# Patient Record
Sex: Male | Born: 1950 | Race: White | Hispanic: No | Marital: Married | Smoking: Never smoker
Health system: Southern US, Community
[De-identification: ages and names within clinical notes are randomized; demographics above are authoritative.]

## PROBLEM LIST (undated history)

## (undated) DIAGNOSIS — I10 Essential (primary) hypertension: Secondary | ICD-10-CM

---

## 2018-04-17 ENCOUNTER — Emergency Department (HOSPITAL_BASED_OUTPATIENT_CLINIC_OR_DEPARTMENT_OTHER)
Admission: EM | Admit: 2018-04-17 | Discharge: 2018-04-17 | Disposition: A | Discharge status: Home / Self Care | Payer: Medicare Other | Attending: Physician Assistant | Admitting: Physician Assistant

## 2018-04-17 ENCOUNTER — Other Ambulatory Visit: Payer: Self-pay

## 2018-04-17 ENCOUNTER — Encounter (HOSPITAL_BASED_OUTPATIENT_CLINIC_OR_DEPARTMENT_OTHER): Payer: Self-pay

## 2018-04-17 ENCOUNTER — Emergency Department (HOSPITAL_BASED_OUTPATIENT_CLINIC_OR_DEPARTMENT_OTHER): Payer: Medicare Other

## 2018-04-17 DIAGNOSIS — Z79899 Other long term (current) drug therapy: Secondary | ICD-10-CM | POA: Diagnosis not present

## 2018-04-17 DIAGNOSIS — R0789 Other chest pain: Secondary | ICD-10-CM | POA: Diagnosis present

## 2018-04-17 DIAGNOSIS — I10 Essential (primary) hypertension: Secondary | ICD-10-CM | POA: Insufficient documentation

## 2018-04-17 HISTORY — DX: Essential (primary) hypertension: I10

## 2018-04-17 LAB — CBC WITH DIFFERENTIAL/PLATELET
BASOS ABS: 0 10*3/uL (ref 0.0–0.1)
Basophils Relative: 1 %
Eosinophils Absolute: 0.3 10*3/uL (ref 0.0–0.7)
Eosinophils Relative: 4 %
HEMATOCRIT: 46.7 % (ref 39.0–52.0)
HEMOGLOBIN: 16.4 g/dL (ref 13.0–17.0)
LYMPHS ABS: 1.7 10*3/uL (ref 0.7–4.0)
Lymphocytes Relative: 25 %
MCH: 31.4 pg (ref 26.0–34.0)
MCHC: 35.1 g/dL (ref 30.0–36.0)
MCV: 89.3 fL (ref 78.0–100.0)
Monocytes Absolute: 0.9 10*3/uL (ref 0.1–1.0)
Monocytes Relative: 12 %
NEUTROS ABS: 4 10*3/uL (ref 1.7–7.7)
NEUTROS PCT: 58 %
PLATELETS: 230 10*3/uL (ref 150–400)
RBC: 5.23 MIL/uL (ref 4.22–5.81)
RDW: 12.8 % (ref 11.5–15.5)
WBC: 6.9 10*3/uL (ref 4.0–10.5)

## 2018-04-17 LAB — COMPREHENSIVE METABOLIC PANEL
ALT: 23 U/L (ref 17–63)
ANION GAP: 10 (ref 5–15)
AST: 26 U/L (ref 15–41)
Albumin: 4.2 g/dL (ref 3.5–5.0)
Alkaline Phosphatase: 44 U/L (ref 38–126)
BILIRUBIN TOTAL: 0.7 mg/dL (ref 0.3–1.2)
BUN: 12 mg/dL (ref 6–20)
CHLORIDE: 100 mmol/L — AB (ref 101–111)
CO2: 25 mmol/L (ref 22–32)
Calcium: 8.7 mg/dL — ABNORMAL LOW (ref 8.9–10.3)
Creatinine, Ser: 0.98 mg/dL (ref 0.61–1.24)
GFR calc Af Amer: >60 mL/min (ref >60)
Glucose, Bld: 105 mg/dL — ABNORMAL HIGH (ref 65–99)
Potassium: 3.6 mmol/L (ref 3.5–5.1)
Sodium: 135 mmol/L (ref 135–145)
TOTAL PROTEIN: 7.1 g/dL (ref 6.5–8.1)

## 2018-04-17 LAB — TROPONIN I: Troponin I: <0.03 ng/mL (ref <0.03)

## 2018-04-17 LAB — BRAIN NATRIURETIC PEPTIDE: B Natriuretic Peptide: 16.1 pg/mL (ref 0.0–100.0)

## 2018-04-17 LAB — D-DIMER, QUANTITATIVE (NOT AT ARMC): D-Dimer, Quant: 0.4 ug/mL-FEU (ref 0.00–0.50)

## 2018-04-17 NOTE — Discharge Instructions (Addendum)
We have ruled out a blood clot in your lungs and a heart attack today.  Please follow-up with your doctor for further evaluation and treatment.  You may need to see a cardiologist for further testing.  Please return to the emergency department if you develop any new or worsening symptoms including worsening chest pain, shortness of breath, or any other new or concerning symptom.

## 2018-04-17 NOTE — ED Provider Notes (Signed)
[facility] EMERGENCY DEPARTMENT Provider Note   CSN: [id number] Arrival date & time: 04/17/18  1444     History   Chief Complaint Chief Complaint  Patient presents with  . Chest Pain    HPI Scott Boyd is a 67 y.o. male with history of hypertension and high cholesterol who presents with a 1 day history of right sided chest pain.  He is asymptomatic now.  He described it as a 1/10  dull ache.  The pain does not radiate.  It has been constant since he awoke this morning.  He reports taking amlodipine for the first time last evening.  He denies any shortness of breath, nausea, vomiting, pleuritic pain.  He reports he was sweaty while he was sleeping last night, however it was hot in the room and he did not have their air conditioning on.  Patient denies any cough.  Patient denies any new leg pain or swelling.  However, patient did just return from [place] and was on a 30-hour flight less than 1 week ago.  No medications taken prior to arrival.  HPI  Past Medical History:  Diagnosis Date  . Hypertension     There are no active problems to display for this patient.   History reviewed. No pertinent surgical history.      Home Medications    Prior to Admission medications   Medication Sig Start Date End Date Taking? Authorizing Provider  amLODipine (NORVASC) 5 MG tablet Take 5 mg by mouth daily.   Yes [provider]  ezetimibe (ZETIA) 10 MG tablet Take 10 mg by mouth daily.   Yes [provider]  labetalol (NORMODYNE) 100 MG tablet Take 100 mg by mouth 2 (two) times daily.   Yes [provider]  levothyroxine (SYNTHROID, LEVOTHROID) 50 MCG tablet Take 50 mcg by mouth daily before breakfast.   Yes [provider]    Family History No family history on file.  Social History Social History   Tobacco Use  . Smoking status: Never Smoker  . Smokeless tobacco: Never Used  Substance Use Topics  . Alcohol use: Yes  . Drug use:  Never     Allergies   Naproxen and Prednisone   Review of Systems Review of Systems  Constitutional: Negative for chills and fever.  HENT: Negative for facial swelling and sore throat.   Respiratory: Negative for shortness of breath.   Cardiovascular: Positive for chest pain.  Gastrointestinal: Negative for abdominal pain, nausea and vomiting.  Genitourinary: Negative for dysuria.  Musculoskeletal: Negative for back pain.  Skin: Negative for rash and wound.  Neurological: Negative for headaches.  Psychiatric/Behavioral: The patient is not nervous/anxious.      Physical Exam Updated Vital Signs BP (!) 144/98   Pulse 64   Temp 98.3 F (36.8 C) (Oral)   Resp 15   Ht 5\' 11"  (1.803 m)   Wt 93 kg (205 lb)   SpO2 97%   BMI 28.59 kg/m   Physical Exam  Constitutional: He appears well-developed and well-nourished. No distress.  HENT:  Head: Normocephalic and atraumatic.  Mouth/Throat: Oropharynx is clear and moist. No oropharyngeal exudate.  Eyes: Pupils are equal, round, and reactive to light. Conjunctivae are normal. Right eye exhibits no discharge. Left eye exhibits no discharge. No scleral icterus.  Neck: Normal range of motion. Neck supple. No thyromegaly present.  Cardiovascular: Normal rate, regular rhythm, normal heart sounds and intact distal pulses. Exam reveals no gallop and no friction rub.  No murmur heard. Pulmonary/Chest: Effort normal and breath sounds normal. No stridor. No respiratory distress. He has no wheezes. He has no rales. He exhibits no tenderness.  Abdominal: Soft. Bowel sounds are normal. He exhibits no distension. There is no tenderness. There is no rebound and no guarding.  Musculoskeletal: He exhibits no edema.  Lymphadenopathy:    He has no cervical adenopathy.  Neurological: He is alert. Coordination normal.  Skin: Skin is warm and dry. No rash noted. He is not diaphoretic. No pallor.  Psychiatric: He has a normal mood and affect.  Nursing  note and vitals reviewed.    ED Treatments / Results  Labs (all labs ordered are listed, but only abnormal results are displayed) Labs Reviewed  COMPREHENSIVE METABOLIC PANEL - Abnormal; Notable for the following components:      Result Value   Chloride 100 (*)    Glucose, Bld 105 (*)    Calcium 8.7 (*)    All other components within normal limits  CBC WITH DIFFERENTIAL/PLATELET  TROPONIN I  BRAIN NATRIURETIC PEPTIDE  D-DIMER, QUANTITATIVE (NOT AT [facility])    EKG EKG Interpretation  Date/Time:  Friday April 17 2018 14:58:08 EDT Ventricular Rate:  67 PR Interval:  176 QRS Duration: 82 QT Interval:  352 QTC Calculation: 371 R Axis:   42 Text Interpretation:  Normal sinus rhythm Nonspecific T wave abnormality Abnormal ECG Normal sinus rhythm Confirmed by Bary CastillaMackuen, Courteney ([id number]) on 04/17/2018 7:52:23 PM   Radiology Dg Chest 2 View  Result Date: 04/17/2018 CLINICAL DATA:  Chest pain. EXAM: CHEST - 2 VIEW COMPARISON:  None. FINDINGS: Midline trachea. Borderline cardiomegaly with a tortuous thoracic aorta. Atherosclerosis in the transverse aorta. No pleural effusion or pneumothorax. Mild volume loss at the left lung base. No congestive failure. 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 IMPRESSION: No acute cardiopulmonary disease. Aortic Atherosclerosis (ICD10-I70.0). Electronically Signed   By: Jeronimo GreavesKyle  Talbot M.D.   On: 04/17/2018 17:40    Procedures Procedures (including critical care time)  Medications Ordered in ED Medications - No data to display   Initial Impression / Assessment and Plan / ED Course  I have reviewed the triage vital signs and the nursing notes.  Pertinent labs & imaging results that were available during my care of the patient were reviewed by me and considered in my medical decision making (see chart for details).     Patient presenting with constant, right-sided chest pain since awakening this morning.  His pain is very  atypical and resolved at this time.  CBC, CMP, troponin, d-dimer, BNP all within normal limits.  Chest x-ray is negative.  EKG shows NSR with nonspecific T wave abnormalities.  Very low suspicion of ACS at this time.  Follow-up to PCP for further workup.  Return precautions discussed.  Patient understands and agrees with plan.  Patient vitals stable throughout ED course and discharged in satisfactory condition.  Patient also evaluated by Dr. Corlis LeakMackuen who guided the patient's management and agrees with plan.  Final Clinical Impressions(s) / ED Diagnoses   Final diagnoses:  Atypical chest pain    ED Discharge Orders    None       Emi HolesLaw, Marveen Donlon M, PA-C 04/17/18 1952    Abelino DerrickMackuen, Courteney Lyn, MD 04/18/18 2350

## 2018-04-17 NOTE — ED Notes (Signed)
ED Provider at bedside. 

## 2018-04-17 NOTE — ED Triage Notes (Signed)
Pt c/o pain to rt of chest, dull feeling; pt states started amolodpine yesterday, PCP sent him here

## 2019-06-26 IMAGING — DX DG CHEST 2V
2 series · 2 of 2 positions shown · non-contrast
Comparison: None.

CLINICAL DATA: Chest pain.

EXAM:
CHEST - 2 VIEW

[chest pa]
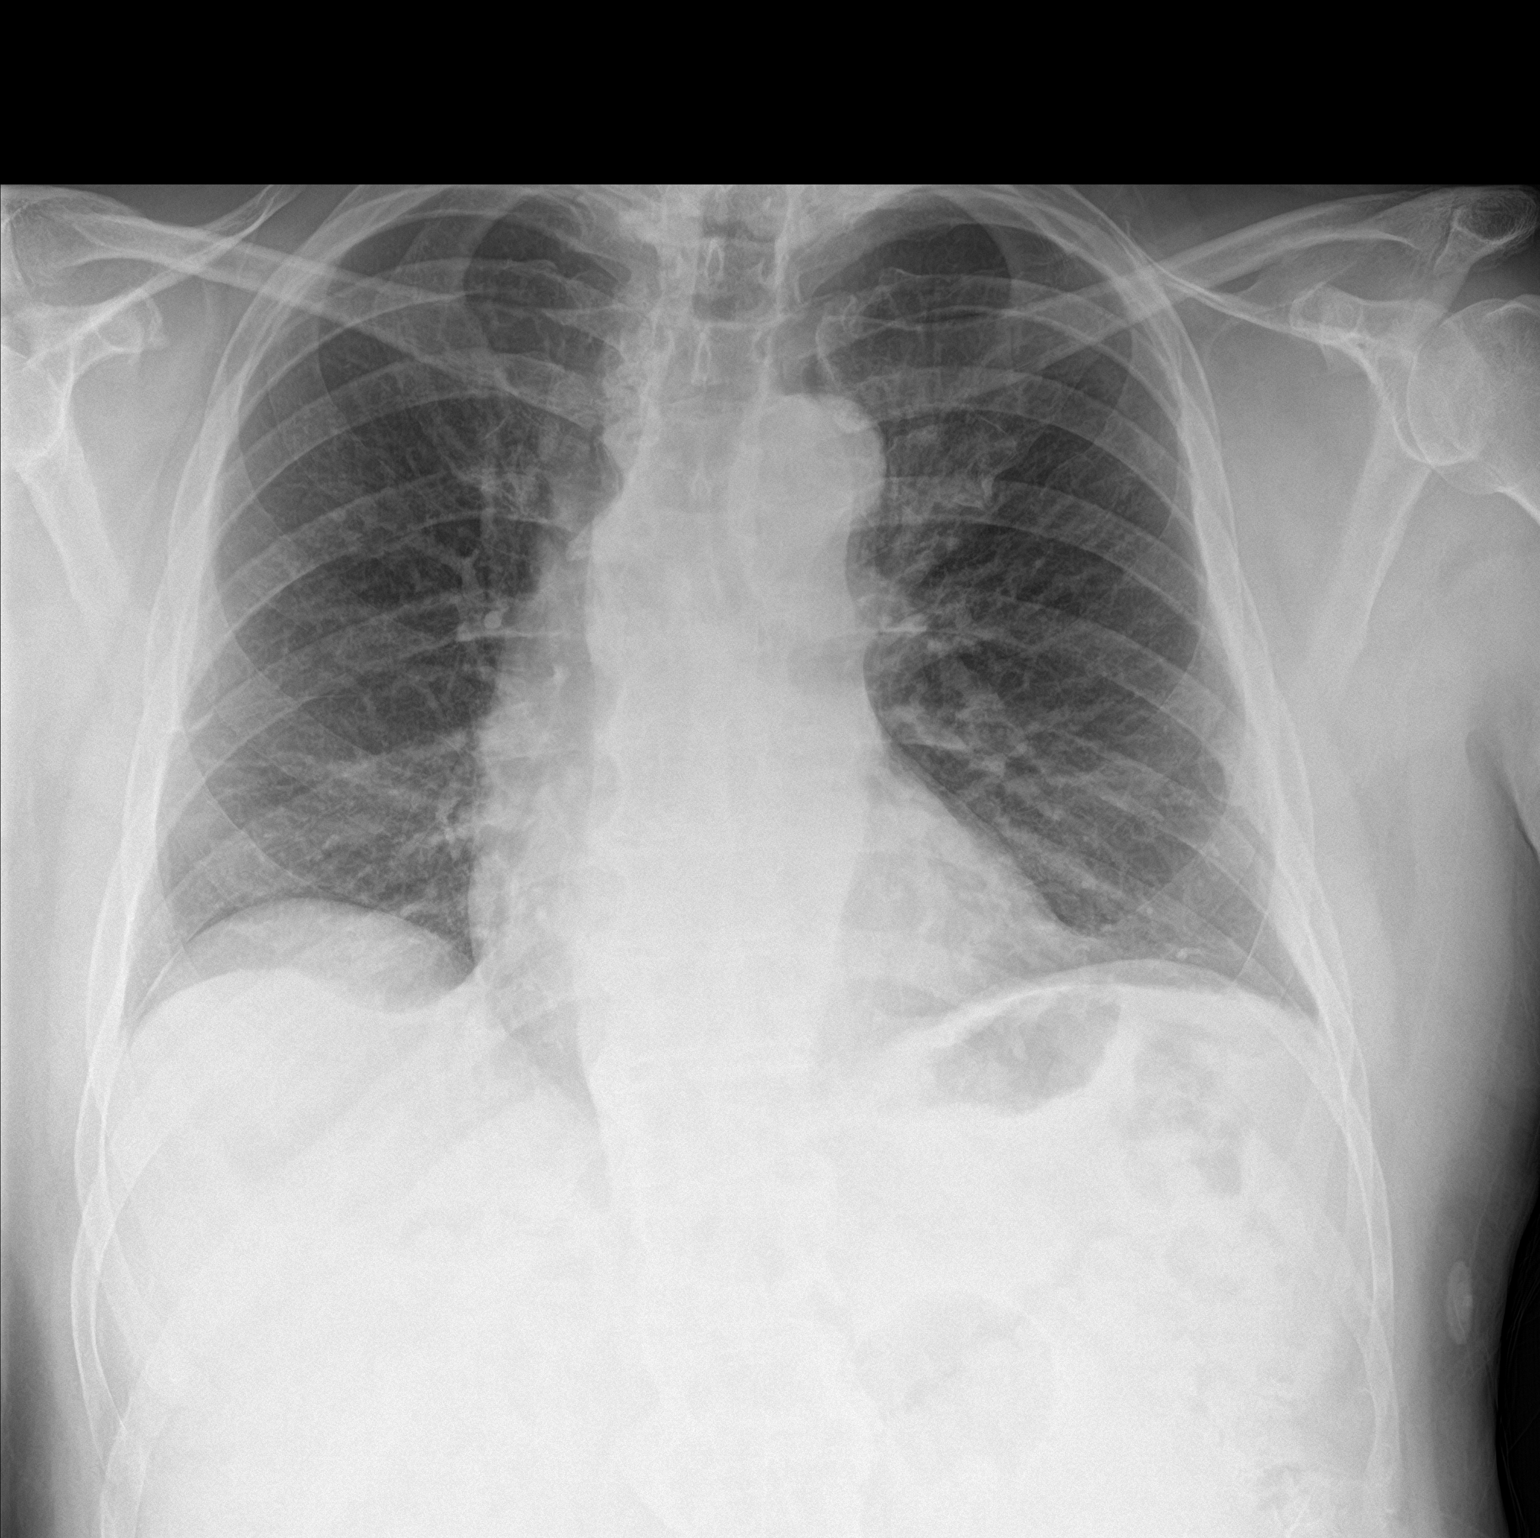

[chest lat]
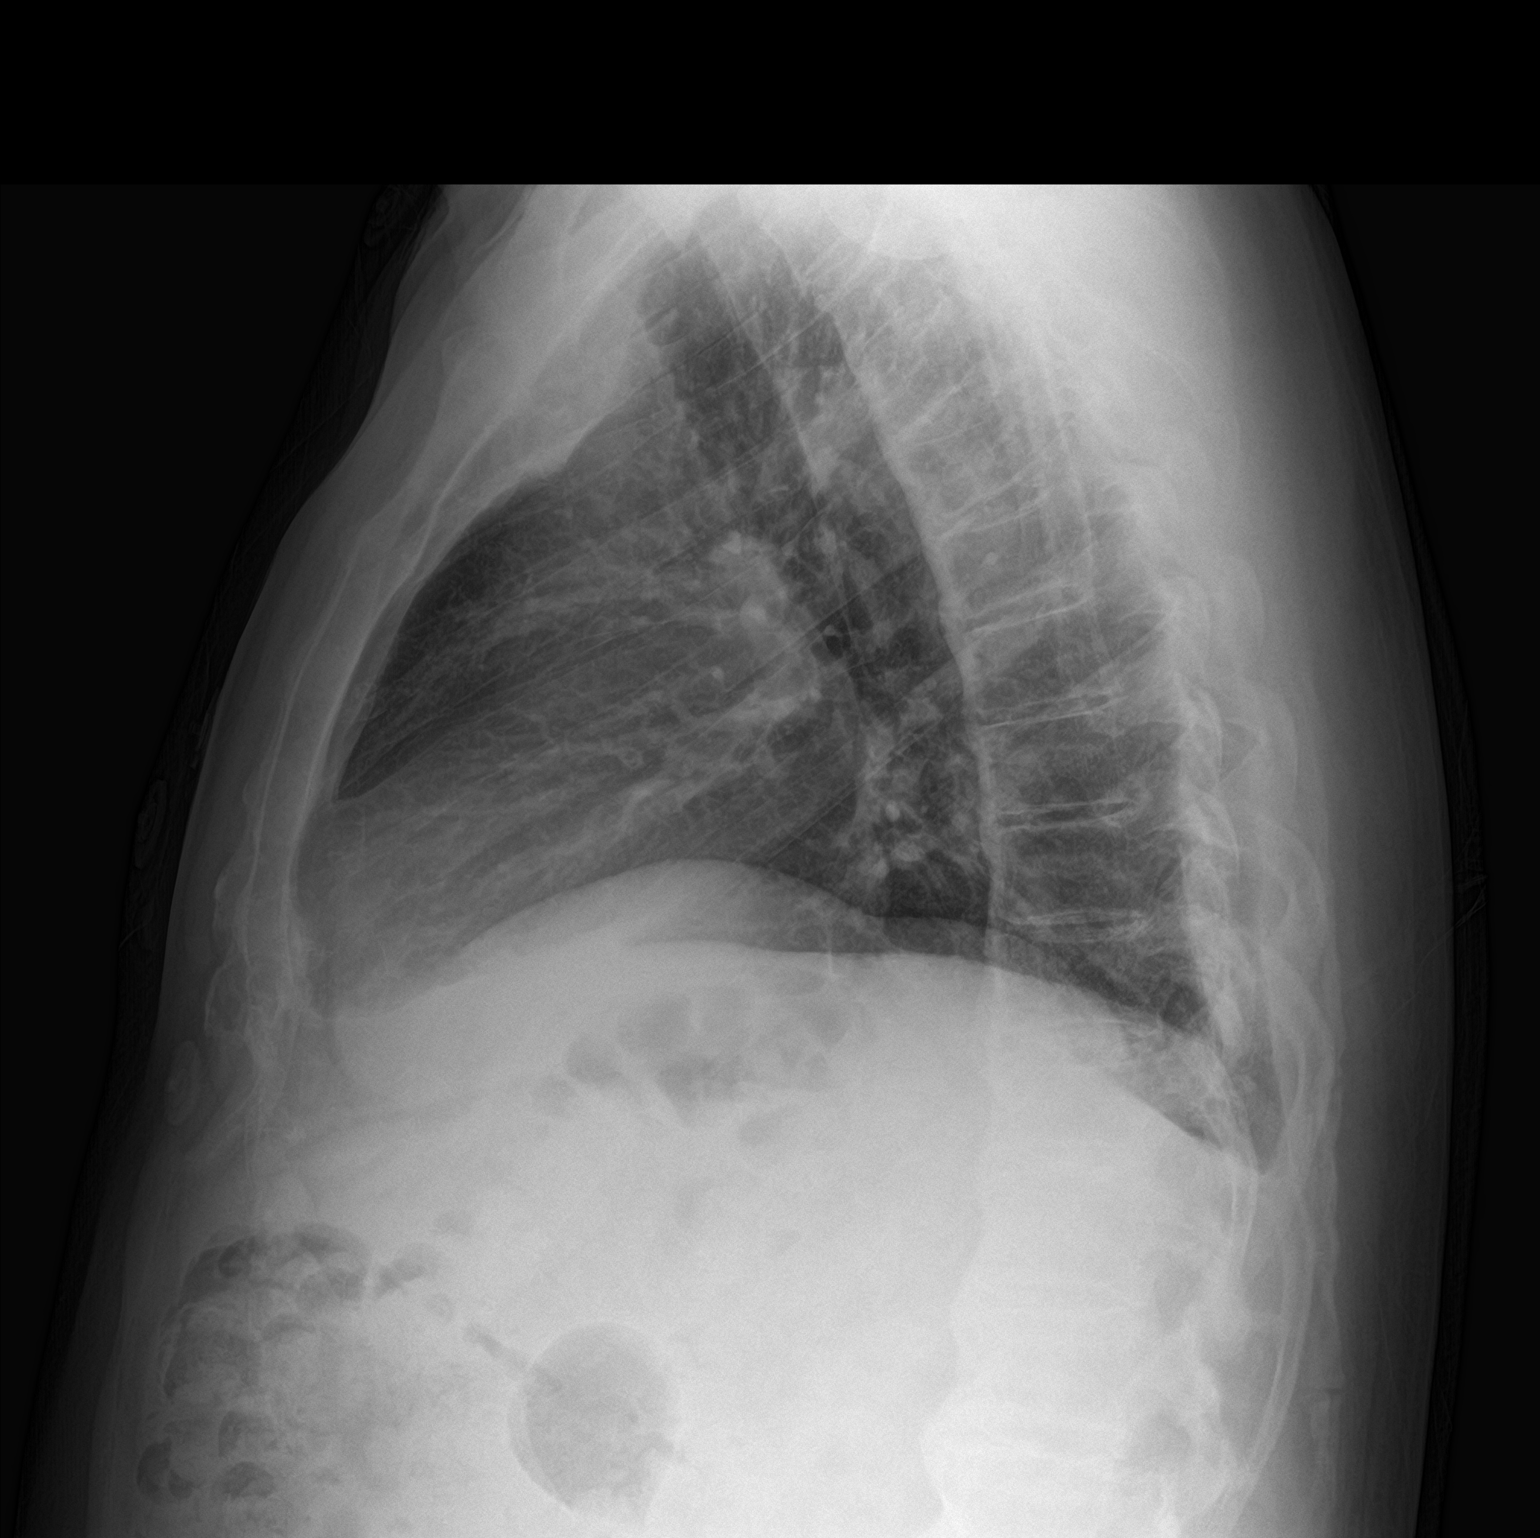

[2 of 2 positions shown; findings below may reference images not displayed]

FINDINGS: Midline trachea. Borderline cardiomegaly with a tortuous thoracic
aorta. Atherosclerosis in the transverse aorta. No pleural effusion
or pneumothorax. Mild volume loss at the left lung base. No
congestive failure.

Diffuse idiopathic skeletal hyperostosis throughout the thoracic
spine.
IMPRESSION: No acute cardiopulmonary disease.

Aortic Atherosclerosis ([id number]-V30.0).
